# Patient Record
Sex: Male | Born: 1990 | Race: Black or African American | Hispanic: No | Marital: Single | Smoking: Current every day smoker
Health system: Southern US, Community
[De-identification: ages and names within clinical notes are randomized; demographics above are authoritative.]

---

## 2010-11-27 ENCOUNTER — Inpatient Hospital Stay (INDEPENDENT_AMBULATORY_CARE_PROVIDER_SITE_OTHER)
Admission: RE | Admit: 2010-11-27 | Discharge: 2010-11-27 | Disposition: A | Discharge status: Home / Self Care | Payer: BC Managed Care – PPO | Source: Ambulatory Visit | Attending: Family Medicine | Admitting: Family Medicine

## 2010-11-27 DIAGNOSIS — K5289 Other specified noninfective gastroenteritis and colitis: Secondary | ICD-10-CM

## 2010-11-27 LAB — OCCULT BLOOD, POC DEVICE: Fecal Occult Bld: NEGATIVE

## 2010-11-30 LAB — STOOL CULTURE

## 2014-11-27 ENCOUNTER — Emergency Department (HOSPITAL_COMMUNITY): Payer: 59

## 2014-11-27 ENCOUNTER — Encounter (HOSPITAL_COMMUNITY): Payer: Self-pay | Admitting: Emergency Medicine

## 2014-11-27 DIAGNOSIS — Z720 Tobacco use: Secondary | ICD-10-CM | POA: Diagnosis not present

## 2014-11-27 DIAGNOSIS — R05 Cough: Secondary | ICD-10-CM | POA: Diagnosis not present

## 2014-11-27 DIAGNOSIS — J3489 Other specified disorders of nose and nasal sinuses: Secondary | ICD-10-CM | POA: Diagnosis not present

## 2014-11-27 DIAGNOSIS — R079 Chest pain, unspecified: Secondary | ICD-10-CM | POA: Insufficient documentation

## 2014-11-27 DIAGNOSIS — R0602 Shortness of breath: Secondary | ICD-10-CM | POA: Insufficient documentation

## 2014-11-27 LAB — CBC
HCT: 41.1 % (ref 39.0–52.0)
HEMOGLOBIN: 13.6 g/dL (ref 13.0–17.0)
MCH: 28.5 pg (ref 26.0–34.0)
MCHC: 33.1 g/dL (ref 30.0–36.0)
MCV: 86.2 fL (ref 78.0–100.0)
Platelets: 205 10*3/uL (ref 150–400)
RBC: 4.77 MIL/uL (ref 4.22–5.81)
RDW: 14.5 % (ref 11.5–15.5)
WBC: 10.6 10*3/uL — ABNORMAL HIGH (ref 4.0–10.5)

## 2014-11-27 LAB — BASIC METABOLIC PANEL
ANION GAP: 9 (ref 5–15)
BUN: 16 mg/dL (ref 6–20)
CALCIUM: 9.7 mg/dL (ref 8.9–10.3)
CO2: 25 mmol/L (ref 22–32)
Chloride: 101 mmol/L (ref 101–111)
Creatinine, Ser: 1.08 mg/dL (ref 0.61–1.24)
GFR calc Af Amer: >60 mL/min (ref >60)
GFR calc non Af Amer: >60 mL/min (ref >60)
GLUCOSE: 102 mg/dL — AB (ref 65–99)
Potassium: 3.5 mmol/L (ref 3.5–5.1)
Sodium: 135 mmol/L (ref 135–145)

## 2014-11-27 LAB — I-STAT TROPONIN, ED: TROPONIN I, POC: 0.01 ng/mL (ref 0.00–0.08)

## 2014-11-27 NOTE — ED Notes (Signed)
Pt reports cp x 1 month. sts he thought it was stress/anxiety related but today pain is worse. Pt started having cold sx on Friday as well.

## 2014-11-28 ENCOUNTER — Emergency Department (HOSPITAL_COMMUNITY)
Admission: EM | Admit: 2014-11-28 | Discharge: 2014-11-28 | Disposition: A | Discharge status: Home / Self Care | Payer: 59 | Attending: Emergency Medicine | Admitting: Emergency Medicine

## 2014-11-28 DIAGNOSIS — R079 Chest pain, unspecified: Secondary | ICD-10-CM

## 2014-11-28 MED ORDER — IBUPROFEN 800 MG PO TABS: 800.0000 mg | ORAL_TABLET | Freq: Three times a day (TID) | ORAL | Status: AC

## 2014-11-28 NOTE — ED Provider Notes (Signed)
CSN: [id number]     Arrival date & time 11/27/14  2144 History  By signing my name below, I, Freida Busman, attest that this documentation has been prepared under the direction and in the presence of Laurence Spates, MD . Electronically Signed: Freida Busman, Scribe. 11/28/2014. 1:07 AM   Chief Complaint  Patient presents with  . Chest Pain   The history is provided by the patient. No language interpreter was used.    HPI Comments:  Antonio Madden. is a 24 y.o. male who presents to the Emergency Department complaining of gradual onset, intermittent CP that began about 1 month ago. Pt notes he was resting when symptom began and the episodes have recently been lasting longer. He notes laying flat improves his pain. Pt reports associated mild intermittent SOB, and cold symptoms- rhinorrhea and occasional cough which began a few days ago. He denies nausea, diaphoresis, abdominal pain, vomiting, and fever. he also denies recent travel outside of the country, and family h/o blood clots. No exacerbating factors noted. CP began before cold sx.  History reviewed. No pertinent past medical history. History reviewed. No pertinent past surgical history. No family history on file. Social History  Substance Use Topics  . Smoking status: Current Every Day Smoker  . Smokeless tobacco: None  . Alcohol Use: Yes    Review of Systems  10 systems reviewed and all are negative for acute change except as noted in the HPI.  Allergies  Review of patient's allergies indicates no known allergies.  Home Medications   Prior to Admission medications   Not on File   BP 144/95 mmHg  Pulse 73  Temp(Src) 99 F (37.2 C) (Oral)  Resp 20  Ht 6' (1.829 m)  Wt 216 lb (97.977 kg)  BMI 29.29 kg/m2  SpO2 100% Physical Exam  Constitutional: He is oriented to person, place, and time. He appears well-developed and well-nourished. No distress.  HENT:  Head: Normocephalic and atraumatic.  Moist mucous  membranes  Eyes: Conjunctivae are normal. Pupils are equal, round, and reactive to light.  Neck: Neck supple.  Cardiovascular: Normal rate, regular rhythm and normal heart sounds.   No murmur heard. Pulmonary/Chest: Effort normal and breath sounds normal.  Abdominal: Soft. Bowel sounds are normal. He exhibits no distension. There is no tenderness.  Musculoskeletal: He exhibits no edema.  Neurological: He is alert and oriented to person, place, and time.  Fluent speech  Skin: Skin is warm and dry.  Psychiatric: He has a normal mood and affect. Judgment normal.  Nursing note and vitals reviewed.   ED Course  Procedures   DIAGNOSTIC STUDIES:  Oxygen Saturation is 100% on RA, normal by my interpretation.    COORDINATION OF CARE:  1:17 AM Pt updated with partial results. Advised pt to take ibuprofen for pain. Return precautions given. Discussed treatment plan with pt at bedside and pt agreed to plan.   Labs Review Labs Reviewed  BASIC METABOLIC PANEL - Abnormal; Notable for the following:    Glucose, Bld 102 (*)    All other components within normal limits  CBC - Abnormal; Notable for the following:    WBC 10.6 (*)    All other components within normal limits  I-STAT TROPOININ, ED   Imaging Review Dg Chest 2 View  11/27/2014  CLINICAL DATA:  Chronic chest pain for 6 weeks, acutely worsening for 2 days. Acute cough and congestion. EXAM: CHEST  2 VIEW COMPARISON:  None. FINDINGS: The cardiomediastinal contours are  normal. The lungs are clear. Pulmonary vasculature is normal. No consolidation, pleural effusion, or pneumothorax. No acute osseous abnormalities are seen. IMPRESSION: No acute pulmonary process. Electronically Signed   By: Rubye OaksMelanie  Ehinger M.D.   On: 11/27/2014 23:05   I have personally reviewed and evaluated these lab results as part of my medical decision-making.   EKG Interpretation   Date/Time:  Thursday November 27 2014 21:49:41 EDT Ventricular Rate:  74 PR  Interval:  146 QRS Duration: 84 QT Interval:  378 QTC Calculation: 419 R Axis:   72 Text Interpretation:  Normal sinus rhythm ST elevation, consider early  repolarization, pericarditis, or injury Abnormal ECG ST elevation early  repol vs pericarditis Confirmed by Deamonte Sayegh MD, Micheal Murad ([id number]) on 11/28/2014  12:43:29 AM      MDM   Final diagnoses:  Chest pain, unspecified chest pain type   24yo M who p/w 1 month of intermittent CP not associated w/ exertion. Pt well appearing w/ normal VS. PE reassuring. EKG w/ early repol vs pericarditis. Pt denies any positional CP or constant pain, therefore pericarditis seems unlikely. Performed bedside US which showed no pericardial effusion. Pt is PERC negative thus PE very unlikely. No risk factors for early heart disease and troponin negative. CXR also negative. Instructed on supportive care and reviewed return precautions. Pt voiced understanding and discharged in satisfactory condition.  I personally performed the services described in this documentation, which was scribed in my presence. The recorded information has been reviewed and is accurate.    Laurence Spatesachel Morgan Dotty Gonzalo, MD 11/28/14 [phone]

## 2016-06-01 ENCOUNTER — Emergency Department (HOSPITAL_COMMUNITY)
Admission: EM | Admit: 2016-06-01 | Discharge: 2016-06-01 | Disposition: A | Discharge status: Home / Self Care | Payer: 59 | Attending: Emergency Medicine | Admitting: Emergency Medicine

## 2016-06-01 ENCOUNTER — Emergency Department (HOSPITAL_COMMUNITY): Payer: 59

## 2016-06-01 ENCOUNTER — Encounter (HOSPITAL_COMMUNITY): Payer: Self-pay | Admitting: Emergency Medicine

## 2016-06-01 DIAGNOSIS — N200 Calculus of kidney: Secondary | ICD-10-CM | POA: Diagnosis not present

## 2016-06-01 DIAGNOSIS — F17210 Nicotine dependence, cigarettes, uncomplicated: Secondary | ICD-10-CM | POA: Insufficient documentation

## 2016-06-01 DIAGNOSIS — Z79899 Other long term (current) drug therapy: Secondary | ICD-10-CM | POA: Diagnosis not present

## 2016-06-01 DIAGNOSIS — R1032 Left lower quadrant pain: Secondary | ICD-10-CM | POA: Diagnosis present

## 2016-06-01 DIAGNOSIS — R109 Unspecified abdominal pain: Secondary | ICD-10-CM

## 2016-06-01 LAB — CBC
HEMATOCRIT: 38.4 % — AB (ref 39.0–52.0)
Hemoglobin: 12.6 g/dL — ABNORMAL LOW (ref 13.0–17.0)
MCH: 27.4 pg (ref 26.0–34.0)
MCHC: 32.8 g/dL (ref 30.0–36.0)
MCV: 83.5 fL (ref 78.0–100.0)
PLATELETS: 207 10*3/uL (ref 150–400)
RBC: 4.6 MIL/uL (ref 4.22–5.81)
RDW: 14.9 % (ref 11.5–15.5)
WBC: 14.8 10*3/uL — ABNORMAL HIGH (ref 4.0–10.5)

## 2016-06-01 LAB — COMPREHENSIVE METABOLIC PANEL
ALBUMIN: 4.3 g/dL (ref 3.5–5.0)
ALT: 34 U/L (ref 17–63)
AST: 33 U/L (ref 15–41)
Alkaline Phosphatase: 36 U/L — ABNORMAL LOW (ref 38–126)
Anion gap: 11 (ref 5–15)
BUN: 18 mg/dL (ref 6–20)
CHLORIDE: 102 mmol/L (ref 101–111)
CO2: 25 mmol/L (ref 22–32)
Calcium: 9.3 mg/dL (ref 8.9–10.3)
Creatinine, Ser: 1.18 mg/dL (ref 0.61–1.24)
GFR calc Af Amer: >60 mL/min (ref >60)
GLUCOSE: 126 mg/dL — AB (ref 65–99)
Potassium: 3.3 mmol/L — ABNORMAL LOW (ref 3.5–5.1)
SODIUM: 138 mmol/L (ref 135–145)
Total Bilirubin: 0.4 mg/dL (ref 0.3–1.2)
Total Protein: 7.3 g/dL (ref 6.5–8.1)

## 2016-06-01 LAB — LIPASE, BLOOD: LIPASE: 21 U/L (ref 11–51)

## 2016-06-01 MED ORDER — MORPHINE SULFATE (PF) 4 MG/ML IV SOLN
6.0000 mg | Freq: Once | INTRAVENOUS | Status: AC
  Administered 2016-06-01: 6 mg via INTRAVENOUS
  Filled 2016-06-01: qty 2

## 2016-06-01 MED ORDER — KETOROLAC TROMETHAMINE 30 MG/ML IJ SOLN
30.0000 mg | Freq: Once | INTRAMUSCULAR | Status: AC
  Administered 2016-06-01: 30 mg via INTRAVENOUS
  Filled 2016-06-01: qty 1

## 2016-06-01 MED ORDER — SODIUM CHLORIDE 0.9 % IV BOLUS (SEPSIS)
1000.0000 mL | Freq: Once | INTRAVENOUS | Status: AC
  Administered 2016-06-01: 1000 mL via INTRAVENOUS

## 2016-06-01 MED ORDER — TRAMADOL HCL 50 MG PO TABS: 50.0000 mg | ORAL_TABLET | Freq: Two times a day (BID) | ORAL | 0 refills | Status: AC | PRN

## 2016-06-01 NOTE — ED Triage Notes (Signed)
Pt c/o 10/10 left lower abd pain that started today, las BM yesterday normal per pt. Denies nausea, vomiting or fever.

## 2016-06-01 NOTE — ED Provider Notes (Signed)
[facility]-EMERGENCY DEPT Provider Note   CSN: [id number] Arrival date & time: 06/01/16  0307  By signing my name below, I, Antonio Madden, attest that this documentation has been prepared under the direction and in the presence of physician practitioner, Tomasita Crumble, MD. Electronically Signed: Linna Madden, Scribe. 06/01/2016. 3:36 AM.  History   Chief Complaint Chief Complaint  Patient presents with  . Abdominal Pain    The history is provided by the patient. No language interpreter was used.     HPI Comments: Antonio Madden. is a 26 y.o. male who presents to the Emergency Department complaining of sudden onset, constant, severe, LLQ pain beginning about one hour ago. No h/o the same. Pt states his pain is worse with applied pressure to his LLQ. He tried ibuprofen PTA without improvement of his pain. Pt denies dysuria, hematuria, hematochezia, fevers, chills, nausea, vomiting, or any other associated symptoms.  History reviewed. No pertinent past medical history.  There are no active problems to display for this patient.   History reviewed. No pertinent surgical history.     Home Medications    Prior to Admission medications   Medication Sig Start Date End Date Taking? Authorizing Provider  ibuprofen (ADVIL,MOTRIN) 200 MG tablet Take 200 mg by mouth every 6 (six) hours as needed for mild pain.   Yes Historical Provider, MD  ibuprofen (ADVIL,MOTRIN) 800 MG tablet Take 1 tablet (800 mg total) by mouth 3 (three) times daily. Patient not taking: Reported on 06/01/2016 11/28/14   Laurence Spates, MD  traMADol (ULTRAM) 50 MG tablet Take 1 tablet (50 mg total) by mouth every 12 (twelve) hours as needed. 06/01/16   Tomasita Crumble, MD    Family History History reviewed. No pertinent family history.  Social History Social History  Substance Use Topics  . Smoking status: Current Every Day Smoker    Packs/day: 2.00    Types: Cigarettes  . Smokeless tobacco: Never Used  .  Alcohol use Yes     Allergies   Morphine and related   Review of Systems Review of Systems  All other systems reviewed and are negative for acute change except as noted in the HPI.  Physical Exam Updated Vital Signs BP 132/84   Pulse 86   Temp 98.3 F (36.8 C)   Resp 20   Ht  (1.854 m)   Wt 230 lb (104.3 kg)   SpO2 98%   BMI 30.34 kg/m   Physical Exam  Constitutional: He is oriented to person, place, and time. Vital signs are normal. He appears well-developed and well-nourished.  Non-toxic appearance. He does not appear ill. He appears distressed.  HENT:  Head: Normocephalic and atraumatic.  Nose: Nose normal.  Mouth/Throat: Oropharynx is clear and moist. No oropharyngeal exudate.  Eyes: Conjunctivae and EOM are normal. Pupils are equal, round, and reactive to light. No scleral icterus.  Neck: Normal range of motion. Neck supple. No tracheal deviation, no edema, no erythema and normal range of motion present. No thyroid mass and no thyromegaly present.  Cardiovascular: Normal rate, regular rhythm, S1 normal, S2 normal, normal heart sounds, intact distal pulses and normal pulses.  Exam reveals no gallop and no friction rub.   No murmur heard. Pulmonary/Chest: Effort normal and breath sounds normal. No respiratory distress. He has no wheezes. He has no rhonchi. He has no rales.  Abdominal: Soft. Normal appearance and bowel sounds are normal. He exhibits no distension, no ascites and no mass. There is no hepatosplenomegaly.  There is tenderness. There is no rebound, no guarding and no CVA tenderness.  LLQ and suprapubic tenderness to palpation.  Musculoskeletal: Normal range of motion. He exhibits no edema or tenderness.  Lymphadenopathy:    He has no cervical adenopathy.  Neurological: He is alert and oriented to person, place, and time. He has normal strength. No cranial nerve deficit or sensory deficit.  Skin: Skin is warm, dry and intact. No petechiae and no rash  noted. He is not diaphoretic. No erythema. No pallor.  Nursing note and vitals reviewed.  ED Treatments / Results  Labs (all labs ordered are listed, but only abnormal results are displayed) Labs Reviewed  COMPREHENSIVE METABOLIC PANEL - Abnormal; Notable for the following:       Result Value   Potassium 3.3 (*)    Glucose, Bld 126 (*)    Alkaline Phosphatase 36 (*)    All other components within normal limits  CBC - Abnormal; Notable for the following:    WBC 14.8 (*)    Hemoglobin 12.6 (*)    HCT 38.4 (*)    All other components within normal limits  LIPASE, BLOOD  URINALYSIS, ROUTINE W REFLEX MICROSCOPIC    EKG  EKG Interpretation None       Radiology Ct Renal Stone Study  Result Date: 06/01/2016 CLINICAL DATA:  Left flank pain EXAM: CT ABDOMEN AND PELVIS WITHOUT CONTRAST TECHNIQUE: Multidetector CT imaging of the abdomen and pelvis was performed following the standard protocol without IV contrast. COMPARISON:  None. FINDINGS: Lower chest: No pulmonary nodules or pleural effusion. No visible pericardial effusion. Hepatobiliary: Normal noncontrast appearance of the liver. No visible biliary dilatation. Normal gallbladder. Pancreas: Normal noncontrast appearance of the pancreas. No peripancreatic fluid collection. Spleen: Normal. Adrenal glands: Normal. Urinary Tract: --Right kidney/ureter: No hydronephrosis or perinephric stranding. No nephrolithiasis. No obstructing ureteral stones. --Left kidney/ureter: There is mild left hydroureteronephrosis with an obstructing stone at the left ureterovesical junction that measures 5 x 4 mm. --Urinary bladder: Unremarkable. Stomach/Bowel: No dilated loops of bowel. No evidence of colonic or enteric inflammation. No fluid collection within the abdomen. Vascular/Lymphatic: No abdominal aortic aneurysm or atherosclerotic calcification. No abdominal or pelvic lymphadenopathy. Reproductive: Normal prostate and seminal vesicles. Musculoskeletal. No  focal osseous lesion. Normal visualized extraperitoneal and extrathoracic soft tissues. IMPRESSION: Acute left-sided obstructive uropathy with 5 x 4 mm stone at the left ureterovesical junction and mild resultant left hydroureteronephrosis. Electronically Signed   By: Deatra Robinson M.D.   On: 06/01/2016 04:27    Procedures Procedures (including critical care time)  DIAGNOSTIC STUDIES: Oxygen Saturation is 98% on RA, normal by my interpretation.    COORDINATION OF CARE: 3:39 AM Discussed treatment plan with pt at bedside and pt agreed to plan.  Medications Ordered in ED Medications  morphine 4 MG/ML injection 6 mg (6 mg Intravenous Given 06/01/16 0402)  ketorolac (TORADOL) 30 MG/ML injection 30 mg (30 mg Intravenous Given 06/01/16 0402)  sodium chloride 0.9 % bolus 1,000 mL (0 mLs Intravenous Stopped 06/01/16 0529)  morphine 4 MG/ML injection 6 mg (6 mg Intravenous Given 06/01/16 0454)     Initial Impression / Assessment and Plan / ED Course  I have reviewed the triage vital signs and the nursing notes.  Pertinent labs & imaging results that were available during my care of the patient were reviewed by me and considered in my medical decision making (see chart for details).      Patient presents to the emergency department for left-sided flank  pain with sudden onset. History is consistent with nephrolithiasis. He was given IV fluids, morphine, Toradol for symptomatic control. Will obtain CT scan for further evaluation. We'll continue to closely monitor.  CT reveals 5mm stone at L UVJ.,  Education provided as well as urology fu.  DC with tramadol for severe pain.  He appears well and in NAD. Pain has resolved after 2nd dose of morphine.  VS are normal. Patient is safe for DC.   Final Clinical Impressions(s) / ED Diagnoses   Final diagnoses:  Left flank pain  Nephrolithiasis    New Prescriptions New Prescriptions   TRAMADOL (ULTRAM) 50 MG TABLET    Take 1 tablet (50 mg total) by  mouth every 12 (twelve) hours as needed.   I personally performed the services described in this documentation, which was scribed in my presence. The recorded information has been reviewed and is accurate.      Tomasita Crumble, MD 06/01/16 [phone]

## 2016-06-01 NOTE — ED Notes (Signed)
Patient transported to CT 

## 2017-12-16 IMAGING — CT CT RENAL STONE PROTOCOL
2 of 4 series · 9 of 46 positions shown, 10 images · non-contrast
Comparison: None.

CLINICAL DATA: Left flank pain

EXAM:
CT ABDOMEN AND PELVIS WITHOUT CONTRAST
TECHNIQUE: Multidetector CT imaging of the abdomen and pelvis was performed
following the standard protocol without IV contrast.

[Series 201: stone study, idose (2) · axial · 0.84mm/px · z∈[-749,-344]mm · 6 of 103 slices shown, 7 images]
[im 13/103  soft-tissue]
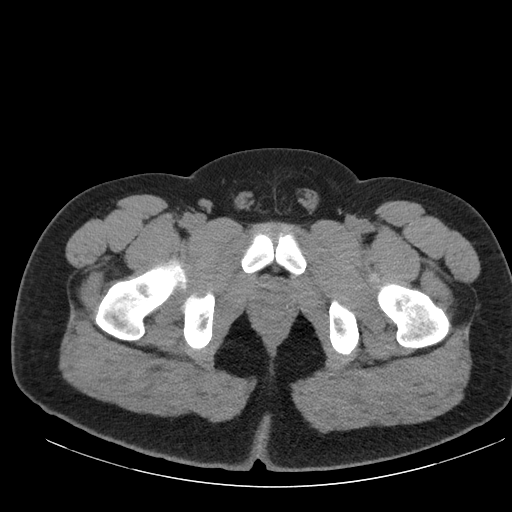
[im 13/103  bone]
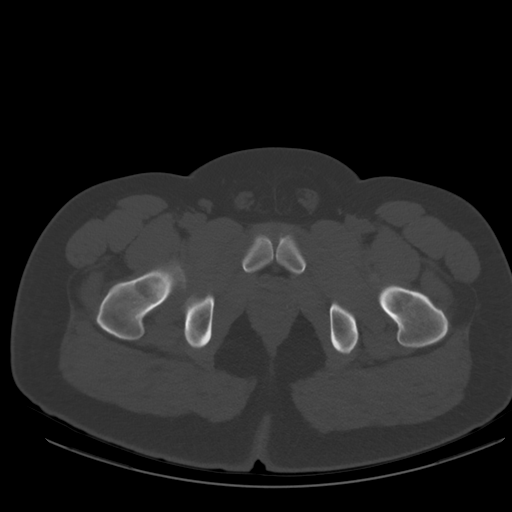
[im 29/103  soft-tissue]
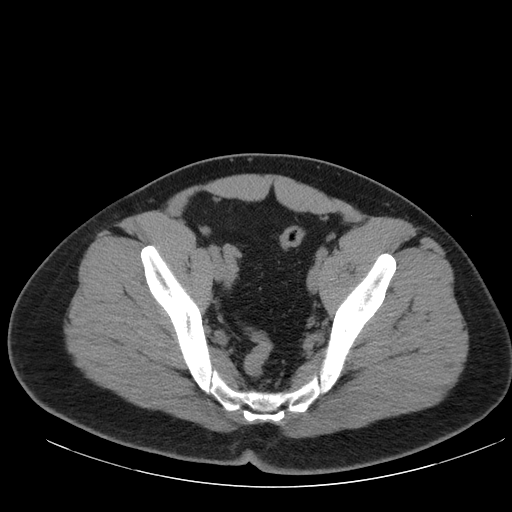
[im 45/103  soft-tissue]
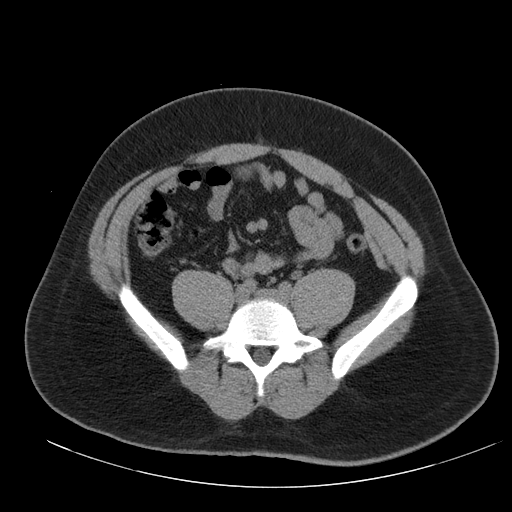
[im 62/103  soft-tissue]
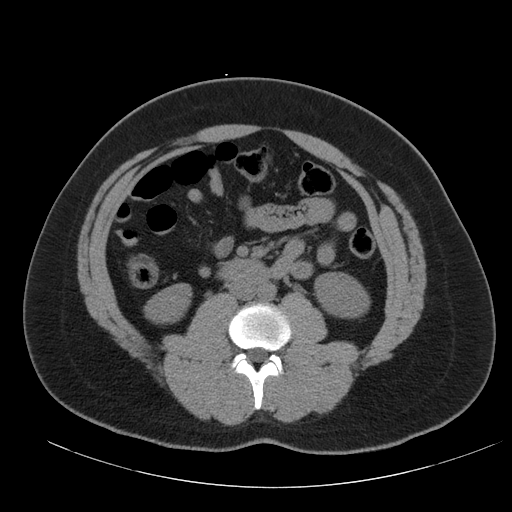
[im 78/103  soft-tissue]
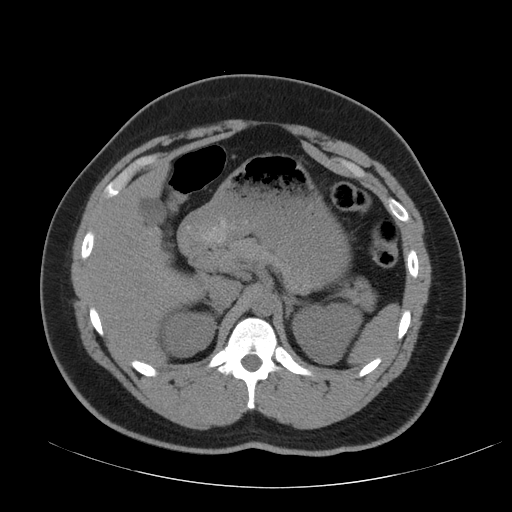
[im 94/103  soft-tissue]
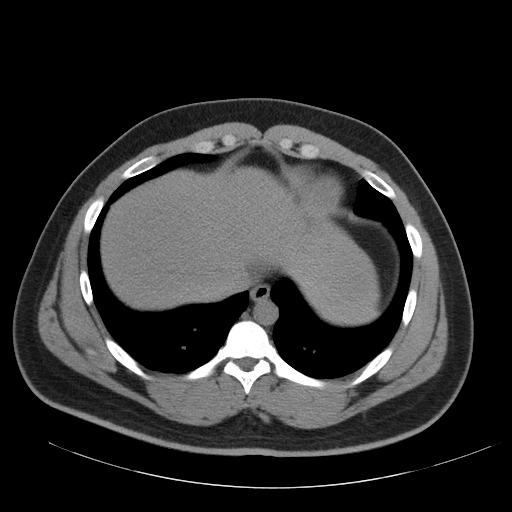

[Series 203: coronals, idose (2) · coronal · 0.45mm/px · 3 of 144 slices shown]
[im 48/144  soft-tissue]
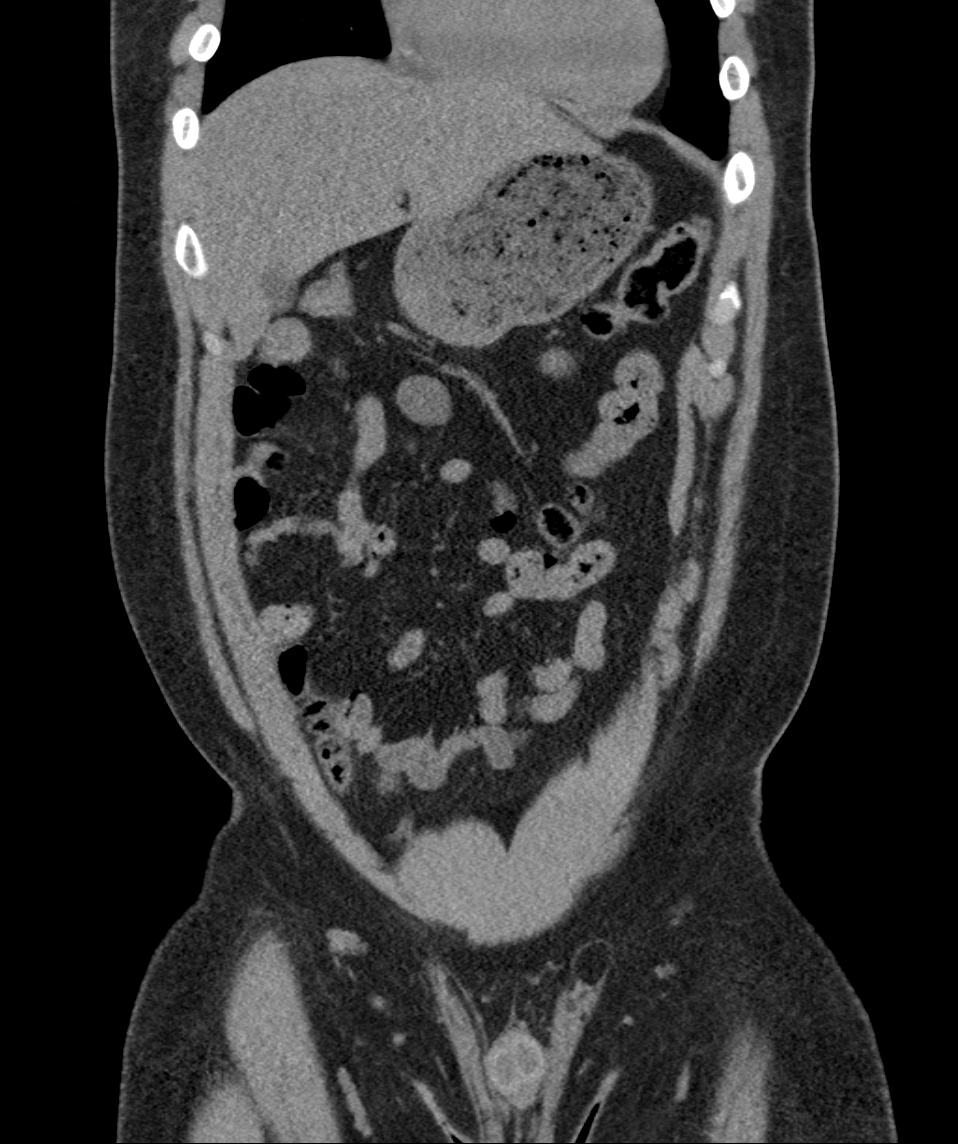
[im 64/144  soft-tissue]
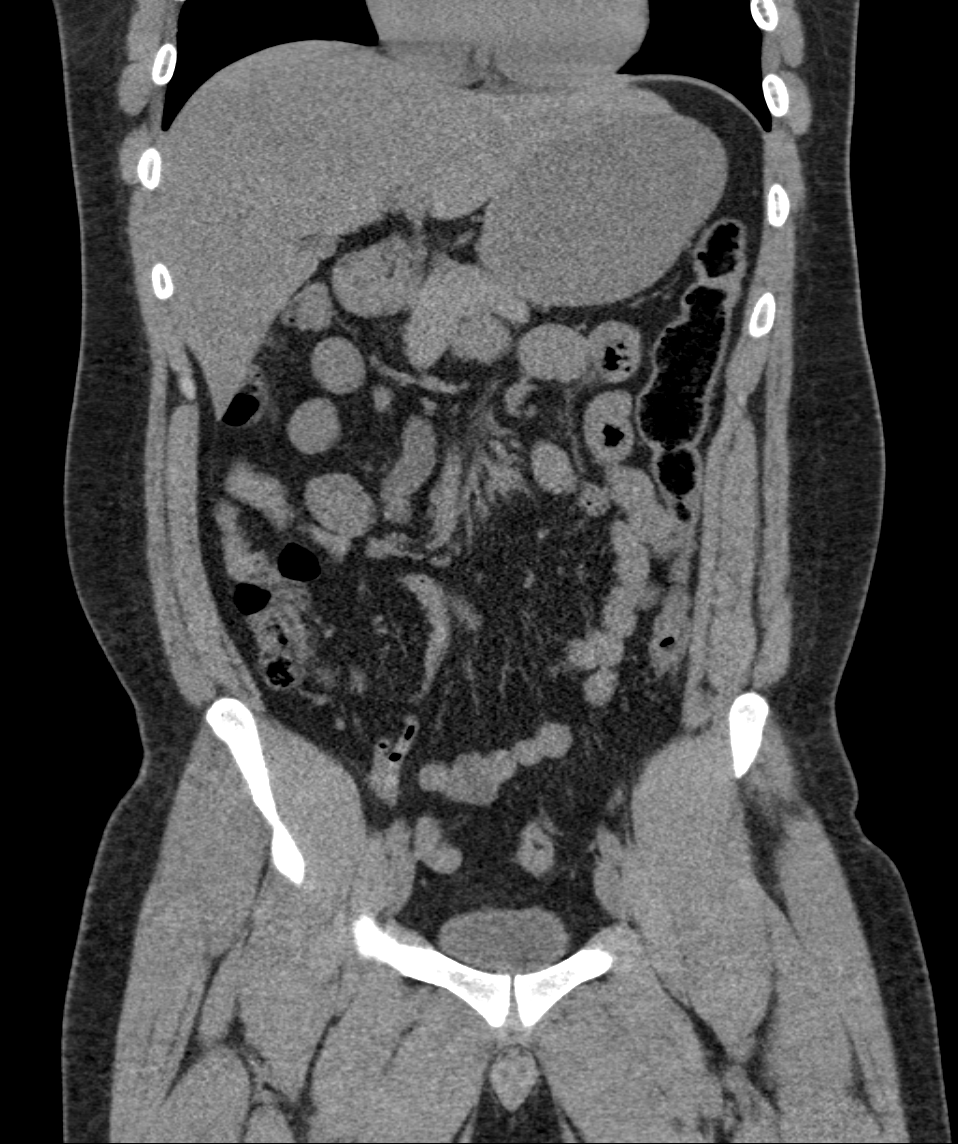
[im 80/144  soft-tissue]
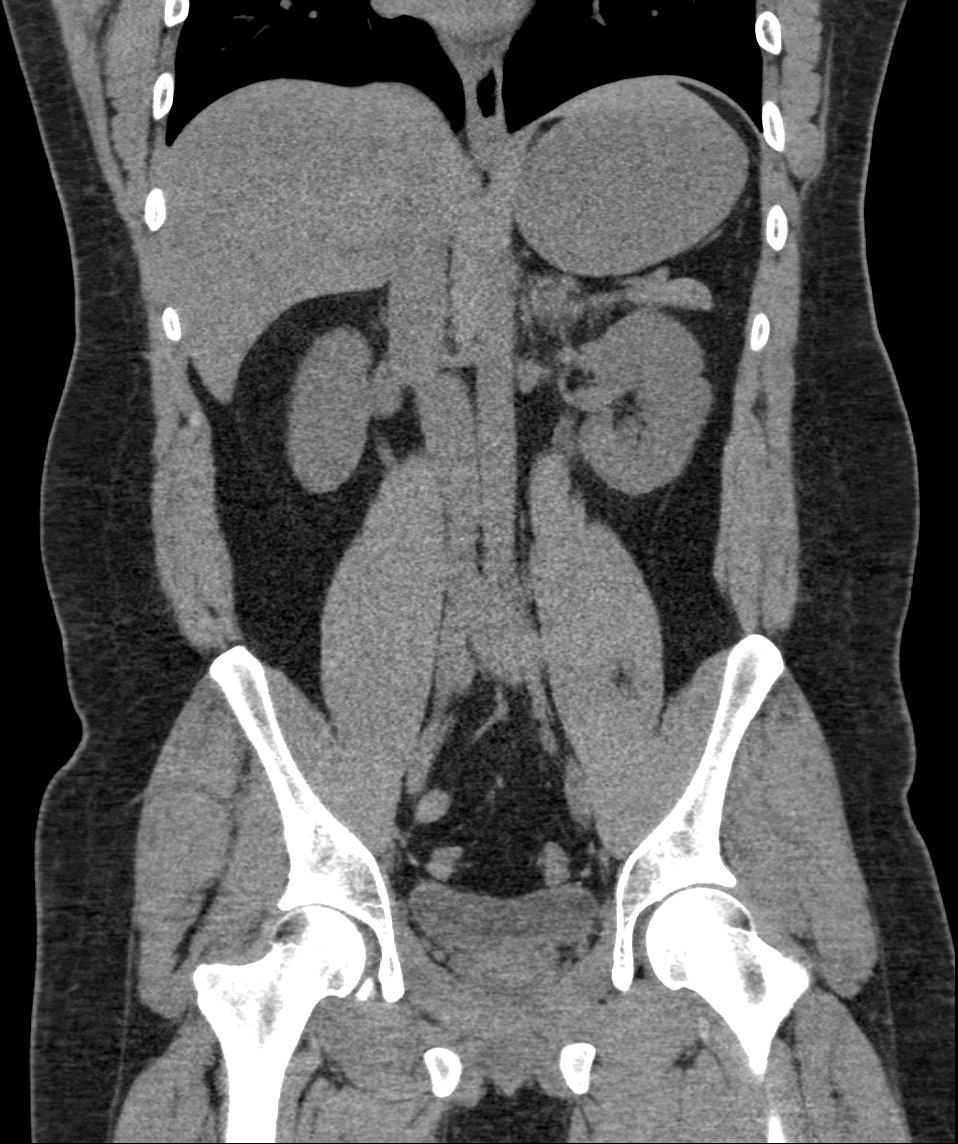

[9 of 46 positions shown; findings below may reference images not displayed]

FINDINGS: Lower chest: No pulmonary nodules or pleural effusion. No visible
pericardial effusion.

Hepatobiliary: Normal noncontrast appearance of the liver. No
visible biliary dilatation. Normal gallbladder.

Pancreas: Normal noncontrast appearance of the pancreas. No
peripancreatic fluid collection.

Spleen: Normal.

Adrenal glands: Normal.

Urinary Tract:

--Right kidney/ureter: No hydronephrosis or perinephric stranding.
No nephrolithiasis. No obstructing ureteral stones.

--Left kidney/ureter: There is mild left hydroureteronephrosis with
an obstructing stone at the left ureterovesical junction that
measures 5 x 4 mm.

--Urinary bladder: Unremarkable.

Stomach/Bowel: No dilated loops of bowel. No evidence of colonic or
enteric inflammation. No fluid collection within the abdomen.

Vascular/Lymphatic: No abdominal aortic aneurysm or atherosclerotic
calcification. No abdominal or pelvic lymphadenopathy.

Reproductive: Normal prostate and seminal vesicles.

Musculoskeletal. No focal osseous lesion. Normal visualized
extraperitoneal and extrathoracic soft tissues.
IMPRESSION: Acute left-sided obstructive uropathy with 5 x 4 mm stone at the
left ureterovesical junction and mild resultant left
hydroureteronephrosis.
